# Patient Record
Sex: Male | Born: 1985 | Race: White | Hispanic: No | Marital: Single | Smoking: Current every day smoker
Health system: Southern US, Community
[De-identification: ages and names within clinical notes are randomized; demographics above are authoritative.]

## PROBLEM LIST (undated history)

## (undated) HISTORY — PX: NO PAST SURGERIES: SHX2092

---

## 2005-09-09 ENCOUNTER — Encounter (HOSPITAL_COMMUNITY): Admission: RE | Admit: 2005-09-09 | Discharge: 2005-10-09 | Payer: Self-pay | Admitting: Orthopaedic Surgery

## 2007-06-01 IMAGING — NM NM BONE 3 PHASE
2 series · 12 of 12 positions shown · non-contrast
Comparison: none

HISTORY: Left foot pain

[Series 1: flow and static · 4.66mm/px · 6 of 32 frames shown (1 of 2)]
[frame 3/32]
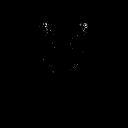
[frame 8/32]
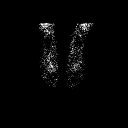
[frame 14/32]
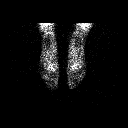
[frame 19/32]
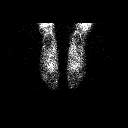
[frame 24/32]
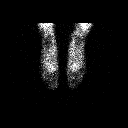
[frame 30/32  full-range]
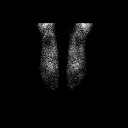

[Series 1: flow and static · 4.66mm/px · 6 of 32 frames shown (2 of 2)]
[frame 3/32]
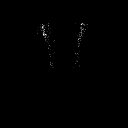
[frame 8/32]
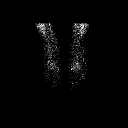
[frame 14/32]
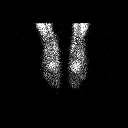
[frame 19/32]
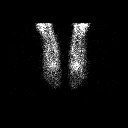
[frame 24/32]
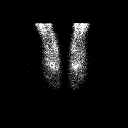
[frame 30/32  full-range]
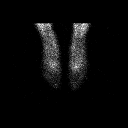

[12 of 12 positions shown; findings below may reference images not displayed]

NUCLEAR MEDICINE THREE-PHASE BONE SCAN LIMITED OF THE FEET:

Three-phase bone scan of the feet performed following 25 mCi GcNNm MDP.
Exam correlated with outside radiographs from [REDACTED]
[HOSPITAL][HOSPITAL] 09/05/2005.

Blood flow images demonstrate symmetric blood flow to both feet.
Blood pool images demonstrate symmetric blood pool in both feet.
Delayed static images show symmetric tracer distribution in both feet.
No focal areas of abnormal increased or decreased tracer localization seen.
IMPRESSION: Normal 3 phase bone scan of the feet.

## 2012-01-14 ENCOUNTER — Emergency Department (HOSPITAL_COMMUNITY)
Admission: EM | Admit: 2012-01-14 | Discharge: 2012-01-14 | Disposition: A | Discharge status: Home / Self Care | Payer: Self-pay | Attending: Emergency Medicine | Admitting: Emergency Medicine

## 2012-01-14 ENCOUNTER — Encounter (HOSPITAL_COMMUNITY): Payer: Self-pay | Admitting: *Deleted

## 2012-01-14 DIAGNOSIS — R112 Nausea with vomiting, unspecified: Secondary | ICD-10-CM | POA: Insufficient documentation

## 2012-01-14 DIAGNOSIS — R42 Dizziness and giddiness: Secondary | ICD-10-CM | POA: Insufficient documentation

## 2012-01-14 MED ORDER — LORAZEPAM 1 MG PO TABS: 1.0000 mg | ORAL_TABLET | Freq: Three times a day (TID) | ORAL | Status: DC | PRN

## 2012-01-14 MED ORDER — LORAZEPAM 1 MG PO TABS
1.0000 mg | ORAL_TABLET | Freq: Once | ORAL | Status: AC
  Administered 2012-01-14: 1 mg via ORAL
  Filled 2012-01-14: qty 1

## 2012-01-14 MED ORDER — MECLIZINE HCL 25 MG PO TABS: 25.0000 mg | ORAL_TABLET | Freq: Four times a day (QID) | ORAL | Status: DC

## 2012-01-14 MED ORDER — MECLIZINE HCL 12.5 MG PO TABS
25.0000 mg | ORAL_TABLET | Freq: Once | ORAL | Status: AC
Start: 2012-01-14 — End: 2012-01-14
  Administered 2012-01-14: 25 mg via ORAL
  Filled 2012-01-14: qty 2

## 2012-01-14 NOTE — ED Provider Notes (Addendum)
History  This chart was scribed for Donnetta Hutching, MD by Manuela Schwartz, ED scribe. This patient was seen in room APA18/APA18 and the patient's care was started at 1147.   CSN: [id number]  Arrival date & time 01/14/12  1147   First MD Initiated Contact with Patient 01/14/12 1159      Chief Complaint  Patient presents with  . Dizziness   HPI Hunter Armstrong is a 26 y.o. male who presents to the Emergency Department complaining of constant dizziness since this AM which began while he was sitting at his office desk this AM while working at his computer. He states he began feeling dizzy which made him feel nauseated/diaphoretic and had 1 emesis episode. He states after walking from his desk he still felt uneasy so he came here to ED. He denies any similar previous episodes. He has no pertinent medical hx and tried taking coricidin without any relief from his symptoms. He denies any ear pain, ear fullness or sore throat. He states he still currently feels dizzy.  No PCP  History reviewed. No pertinent past medical history.  History reviewed. No pertinent past surgical history.  History reviewed. No pertinent family history.  History  Substance Use Topics  . Smoking status: Never Smoker   . Smokeless tobacco: Not on file  . Alcohol Use: Yes     Comment: rarely      Review of Systems  All other systems reviewed and are negative.    Allergies  Review of patient's allergies indicates no known allergies.  Home Medications   Current Outpatient Rx  Name  Route  Sig  Dispense  Refill  . VICKS VAPOR INHALER IN   Inhalation   Inhale 1 puff into the lungs daily as needed. Congestion.         Merrilee Seashore D PO   Oral   Take 1 tablet by mouth daily as needed. Cold.           Triage vitals: BP 122/76  Pulse 78  Temp 97.5 F (36.4 C) (Oral)  Resp 18  Ht 6' (1.829 m)  Wt 170 lb (77.111 kg)  BMI 23.06 kg/m2  SpO2 100%  Physical Exam  Nursing note and vitals  reviewed. Constitutional: He is oriented to person, place, and time. He appears well-developed and well-nourished.  HENT:  Head: Normocephalic and atraumatic.  Eyes: Conjunctivae normal and EOM are normal. Pupils are equal, round, and reactive to light.  Neck: Normal range of motion. Neck supple.  Cardiovascular: Normal rate, regular rhythm and normal heart sounds.   Pulmonary/Chest: Effort normal and breath sounds normal.  Abdominal: Soft. Bowel sounds are normal.  Musculoskeletal: Normal range of motion.  Neurological: He is alert and oriented to person, place, and time.  Skin: Skin is warm and dry.  Psychiatric: He has a normal mood and affect.    ED Course  Procedures (including critical care time) DIAGNOSTIC STUDIES: Oxygen Saturation is 100% on room air, normal by my interpretation.    COORDINATION OF CARE: At 1230 PM Discussed treatment plan with patient which includes EKG, ativan, antivert. Patient agrees.     Date: 01/14/2012  Rate: 62  Rhythm: normal sinus rhythm  QRS Axis: normal  Intervals: normal  ST/T Wave abnormalities: normal  Conduction Disutrbances: none  Narrative Interpretation: unremarkable  c SA  Labs Reviewed - No data to display No results found.   No diagnosis found.    MDM   History and physical congruent with vertigo.  No obvious neuro deficits. Patient extremely low risk for any neurological event. Discharge home on Antivert 25 mg #15 and Ativan 1 mg #15  I personally performed the services described in this documentation, which was scribed in my presence. The recorded information has been reviewed and is accurate.          Donnetta Hutching, MD 01/14/12 1454  Donnetta Hutching, MD 01/31/12 1610  Donnetta Hutching, MD 02/18/12 [phone]

## 2012-01-14 NOTE — ED Notes (Signed)
Pt was sitting at desk, sudden onset dizziness, nausea, vomiting,,  Feels shakey.

## 2012-01-14 NOTE — ED Provider Notes (Deleted)
History     CSN: [id number]  Arrival date & time 01/14/12  1147   First MD Initiated Contact with Patient 01/14/12 1159      Chief Complaint  Patient presents with  . Dizziness    (Consider location/radiation/quality/duration/timing/severity/associated sxs/prior treatment) HPI  History reviewed. No pertinent past medical history.  History reviewed. No pertinent past surgical history.  History reviewed. No pertinent family history.  History  Substance Use Topics  . Smoking status: Never Smoker   . Smokeless tobacco: Not on file  . Alcohol Use: Yes     Comment: rarely      Review of Systems  Allergies  Review of patient's allergies indicates no known allergies.  Home Medications  No current outpatient prescriptions on file.  BP 122/76  Pulse 78  Temp 97.5 F (36.4 C) (Oral)  Resp 18  Ht 6' (1.829 m)  Wt 170 lb (77.111 kg)  BMI 23.06 kg/m2  SpO2 100%  Physical Exam  ED Course  Procedures (including critical care time)    Date: 01/14/2012  Rate: 62  Rhythm: normal sinus rhythm  QRS Axis: normal  Intervals: normal  ST/T Wave abnormalities: normal  Conduction Disutrbances: none  Narrative Interpretation: unremarkable c SA   Labs Reviewed - No data to display No results found.   No diagnosis found.    MDM  See scribed chart from 11/19/ 2013 at 14:54 by Ladona Ridgel Day.   I personally performed the services described in this documentation, which was scribed in my presence. The recorded information has been reviewed and is accurate.         Donnetta Hutching, MD 01/21/12 2134

## 2014-01-31 ENCOUNTER — Encounter: Payer: Self-pay | Admitting: Gastroenterology

## 2014-03-11 ENCOUNTER — Ambulatory Visit: Payer: Self-pay | Admitting: Gastroenterology

## 2014-04-08 ENCOUNTER — Encounter: Payer: Self-pay | Admitting: Gastroenterology

## 2014-04-08 ENCOUNTER — Ambulatory Visit (INDEPENDENT_AMBULATORY_CARE_PROVIDER_SITE_OTHER): Payer: BC Managed Care – PPO | Admitting: Gastroenterology

## 2014-04-08 VITALS — BP 137/78 | HR 66 | Temp 97.5°F | Ht 72.0 in | Wt 168.2 lb

## 2014-04-08 DIAGNOSIS — R1030 Lower abdominal pain, unspecified: Secondary | ICD-10-CM

## 2014-04-08 DIAGNOSIS — K529 Noninfective gastroenteritis and colitis, unspecified: Secondary | ICD-10-CM | POA: Insufficient documentation

## 2014-04-08 DIAGNOSIS — R109 Unspecified abdominal pain: Secondary | ICD-10-CM | POA: Insufficient documentation

## 2014-04-08 MED ORDER — OMEPRAZOLE MAGNESIUM 20 MG PO TBEC: 20.0000 mg | DELAYED_RELEASE_TABLET | Freq: Every day | ORAL | Status: DC

## 2014-04-08 NOTE — Progress Notes (Signed)
Primary Care Physician:  Pershing ProudJACKSON,SAMANTHA, PA-C  Primary Gastroenterologist:  Jonette EvaSandi Fields, MD   Chief Complaint  Patient presents with  . Abdominal Pain    HPI:  Hunter Armstrong is a 29 y.o. male here for further evaluation of abdominal pain associated with loose stool at the request of Terie PurserSamantha Jackson, PA-C [facility].   Patient reports 10 year history of GI issues including postprandial abdominal crampy, severe gas, fecal urgency and terrible diarrhea. Symptoms usually occur within one hour of meal. Symptoms last for minutes to hours. Improved abdominal pain with bowel movement. Denies constipation. No blood in the stool or melena. Trigger foods include raw onions, tomatoes, spicy and greasy foods. Symptoms really bad with steak. For the most part has been able to control his symptoms with diet, but more difficult to manage over the last couple of months. Denies associated nausea or vomiting. Consumes Limited dairy except for occasional cheese. Recently given prescription for dicyclomine, seems to help with fecal urgency but otherwise continues to have abdominal pain and loose bowel. Just able to control little bit better. In the past had taken a couple of omeprazole which seemed to have helped. Denies typical heartburn. Prior to his mid 1420s he had significant indigestion and heartburn but this seemed to resolve with improved dietary intake.  No previous GI evaluation. Mother diagnosed with IBS. Father has GERD. No family history of IBD, celiac disease.    Current Outpatient Prescriptions  Medication Sig Dispense Refill  . dicyclomine (BENTYL) 20 MG tablet Take 20 mg by mouth 2 (two) times daily.   11   No current facility-administered medications for this visit.    Allergies as of 04/08/2014  . (No Known Allergies)    No past medical history on file.  No past surgical history on file.  Family History  Problem Relation Age of Onset  . Irritable bowel syndrome  Mother   . GER disease Father   . Inflammatory bowel disease Neg Hx   . Colon cancer Neg Hx     History   Social History  . Marital Status: Single    Spouse Name: N/A  . Number of Children: 0  . Years of Education: N/A   Occupational History  . English professor at [employer]    Social History Main Topics  . Smoking status: Never Smoker   . Smokeless tobacco: Not on file  . Alcohol Use: Yes     Comment: rarely  . Drug Use: No  . Sexual Activity: Not on file   Other Topics Concern  . Not on file   Social History Narrative      ROS:  General: Negative for anorexia, weight loss, fever, chills, fatigue, weakness. Eyes: Negative for vision changes.  ENT: Negative for hoarseness, difficulty swallowing , nasal congestion. CV: Negative for chest pain, angina, palpitations, dyspnea on exertion, peripheral edema.  Respiratory: Negative for dyspnea at rest, dyspnea on exertion, cough, sputum, wheezing.  GI: See history of present illness. GU:  Negative for dysuria, hematuria, urinary incontinence, urinary frequency, nocturnal urination.  MS: Negative for joint pain, low back pain.  Derm: Negative for rash or itching.  Neuro: Negative for weakness, abnormal sensation, seizure, frequent headaches, memory loss, confusion.  Psych: Negative for anxiety, depression, suicidal ideation, hallucinations.  Endo: Negative for unusual weight change.  Heme: Negative for bruising or bleeding. Allergy: Negative for rash or hives.    Physical Examination:  BP 137/78 mmHg  Pulse 66  Temp(Src) 97.5 F (  36.4 C) (Oral)  Ht 6' (1.829 m)  Wt 168 lb 3.2 oz (76.295 kg)  BMI 22.81 kg/m2   General: Well-nourished, well-developed in no acute distress.  Head: Normocephalic, atraumatic.   Eyes: Conjunctiva pink, no icterus. Mouth: Oropharyngeal mucosa moist and pink , no lesions erythema or exudate. Neck: Supple without thyromegaly, masses, or lymphadenopathy.  Lungs: Clear to auscultation  bilaterally.  Heart: Regular rate and rhythm, no murmurs rubs or gallops.  Abdomen: Bowel sounds are normal, nontender, nondistended, no hepatosplenomegaly or masses, no abdominal bruits or    hernia , no rebound or guarding.   Rectal: not performed Extremities: No lower extremity edema. No clubbing or deformities.  Neuro: Alert and oriented x 4 , grossly normal neurologically.  Skin: Warm and dry, no rash or jaundice.   Psych: Alert and cooperative, normal mood and affect.  Labs: Labs have been requested.  Imaging Studies: No results found.

## 2014-04-08 NOTE — Assessment & Plan Note (Signed)
29 year old gentleman with 10 year history of postprandial abdominal pain associated with frequent loose stool. Occasional nocturnal symptoms. Minimal improvement with dicyclomine. No family history of IBD, celiac disease. Mother has history of IBS. Symptoms sound pretty typical for IBS but offered workup to exclude other etiologies per patient's request.  We have requested recent labs. We will screen for celiac disease. I FOBT. Based on findings may offer ileocolonoscopy to rule out IBD. Further recommendations to follow.  IBS literature provided today.

## 2014-04-08 NOTE — Patient Instructions (Signed)
Please have your labs and stool test done.  Start Prilosec OTC once daily before breakfast for next 2-3 weeks.   We will call with results and decide about colonoscopy at that time based on findings.   I suspect symptoms are related to IBS but we will rule out other more serious diagnoses.   Irritable Bowel Syndrome Irritable bowel syndrome (IBS) is caused by a disturbance of normal bowel function and is a common digestive disorder. You may also hear this condition called spastic colon, mucous colitis, and irritable colon. There is no cure for IBS. However, symptoms often gradually improve or disappear with a good diet, stress management, and medicine. This condition usually appears in late adolescence or early adulthood. Women develop it twice as often as men. CAUSES  After food has been digested and absorbed in the small intestine, waste material is moved into the large intestine, or colon. In the colon, water and salts are absorbed from the undigested products coming from the small intestine. The remaining residue, or fecal material, is held for elimination. Under normal circumstances, gentle, rhythmic contractions of the bowel walls push the fecal material along the colon toward the rectum. In IBS, however, these contractions are irregular and poorly coordinated. The fecal material is either retained too long, resulting in constipation, or expelled too soon, producing diarrhea. SIGNS AND SYMPTOMS  The most common symptom of IBS is abdominal pain. It is often in the lower left side of the abdomen, but it may occur anywhere in the abdomen. The pain comes from spasms of the bowel muscles happening too much and from the buildup of gas and fecal material in the colon. This pain:  Can range from sharp abdominal cramps to a dull, continuous ache.  Often worsens soon after eating.  Is often relieved by having a bowel movement or passing gas. Abdominal pain is usually accompanied by constipation,  but it may also produce diarrhea. The diarrhea often occurs right after a meal or upon waking up in the morning. The stools are often soft, watery, and flecked with mucus. Other symptoms of IBS include:  Bloating.  Loss of appetite.  Heartburn.  Backache.  Dull pain in the arms or shoulders.  Nausea.  Burping.  Vomiting.  Gas. IBS may also cause symptoms that are unrelated to the digestive system, such as:  Fatigue.  Headaches.  Anxiety.  Shortness of breath.  Trouble concentrating.  Dizziness. These symptoms tend to come and go. DIAGNOSIS  The symptoms of IBS may seem like symptoms of other, more serious digestive disorders. Your health care provider may want to perform tests to exclude these disorders.  TREATMENT Many medicines are available to help correct bowel function or relieve bowel spasms and abdominal pain. Among the medicines available are:  Laxatives for severe constipation and to help restore normal bowel habits.  Specific antidiarrheal medicines to treat severe or lasting diarrhea.  Antispasmodic agents to relieve intestinal cramps. Your health care provider may also decide to treat you with a mild tranquilizer or sedative during unusually stressful periods in your life. Your health care provider may also prescribe antidepressant medicine. The use of this medicine has been shown to reduce pain and other symptoms of IBS. Remember that if any medicine is prescribed for you, you should take it exactly as directed. Make sure your health care provider knows how well it worked for you. HOME CARE INSTRUCTIONS   Take all medicines as directed by your health care provider.  Avoid foods  that are high in fat or oils, such as heavy cream, butter, frankfurters, sausage, and other fatty meats.  Avoid foods that make you go to the bathroom, such as fruit, fruit juice, and dairy products.  Cut out carbonated drinks, chewing gum, and "gassy" foods such as beans and  cabbage. This may help relieve bloating and burping.  Eat foods with bran, and drink plenty of liquids with the bran foods. This helps relieve constipation.  Keep track of what foods seem to bring on your symptoms.  Avoid emotionally charged situations or circumstances that produce anxiety.  Start or continue exercising.  Get plenty of rest and sleep. Document Released: 02/11/2005 Document Revised: 02/16/2013 Document Reviewed: 10/02/2007 [facility] Patient Information 2015 [employer] [place]. This information is not intended to replace advice given to you by your health care provider. Make sure you discuss any questions you have with your health care provider.

## 2014-04-18 NOTE — Progress Notes (Signed)
cc'ed to pcp °

## 2014-06-07 NOTE — Progress Notes (Signed)
Labs reviewed from December 2015 performed by PCP. BUN 14, creatinine 1.12, total bilirubin 0.3, alkaline phosphatase 45, AST 12, ALT 8, albumin 4.6, white blood cell count 6400, hemoglobin 14.4, platelets 167,000   Please asked patient if he plans to complete labs and stool studies so that we can determine course of action/next step in management.

## 2014-06-08 NOTE — Progress Notes (Signed)
Letter mailed to pt also to call and let us know.

## 2014-06-08 NOTE — Progress Notes (Signed)
LMOM to call and let us know if he will do the stool studies and labs.

## 2017-12-18 NOTE — Progress Notes (Signed)
REVIEWED-NO ADDITIONAL RECOMMENDATIONS. 

## 2022-09-07 ENCOUNTER — Other Ambulatory Visit: Payer: Self-pay

## 2022-09-07 ENCOUNTER — Emergency Department (HOSPITAL_COMMUNITY)
Admission: EM | Admit: 2022-09-07 | Discharge: 2022-09-07 | Disposition: A | Discharge status: Home / Self Care | Payer: BC Managed Care – PPO | Attending: Emergency Medicine | Admitting: Emergency Medicine

## 2022-09-07 ENCOUNTER — Encounter (HOSPITAL_COMMUNITY): Payer: Self-pay | Admitting: *Deleted

## 2022-09-07 ENCOUNTER — Emergency Department (HOSPITAL_COMMUNITY): Payer: BC Managed Care – PPO

## 2022-09-07 DIAGNOSIS — K625 Hemorrhage of anus and rectum: Secondary | ICD-10-CM | POA: Diagnosis present

## 2022-09-07 DIAGNOSIS — K529 Noninfective gastroenteritis and colitis, unspecified: Secondary | ICD-10-CM | POA: Insufficient documentation

## 2022-09-07 LAB — COMPREHENSIVE METABOLIC PANEL
ALT: 20 U/L (ref 0–44)
AST: 22 U/L (ref 15–41)
Albumin: 4.3 g/dL (ref 3.5–5.0)
Alkaline Phosphatase: 53 U/L (ref 38–126)
Anion gap: 16 — ABNORMAL HIGH (ref 5–15)
BUN: 17 mg/dL (ref 6–20)
CO2: 21 mmol/L — ABNORMAL LOW (ref 22–32)
Calcium: 9.5 mg/dL (ref 8.9–10.3)
Chloride: 103 mmol/L (ref 98–111)
Creatinine, Ser: 1.2 mg/dL (ref 0.61–1.24)
GFR, Estimated: >60 mL/min (ref >60)
Glucose, Bld: 129 mg/dL — ABNORMAL HIGH (ref 70–99)
Potassium: 3.5 mmol/L (ref 3.5–5.1)
Sodium: 140 mmol/L (ref 135–145)
Total Bilirubin: 1 mg/dL (ref 0.3–1.2)
Total Protein: 6.6 g/dL (ref 6.5–8.1)

## 2022-09-07 LAB — TYPE AND SCREEN
ABO/RH(D): A POS
Antibody Screen: NEGATIVE

## 2022-09-07 LAB — CBC
HCT: 43.6 % (ref 39.0–52.0)
Hemoglobin: 14.8 g/dL (ref 13.0–17.0)
MCH: 29.5 pg (ref 26.0–34.0)
MCHC: 33.9 g/dL (ref 30.0–36.0)
MCV: 86.9 fL (ref 80.0–100.0)
Platelets: 176 10*3/uL (ref 150–400)
RBC: 5.02 MIL/uL (ref 4.22–5.81)
RDW: 13.1 % (ref 11.5–15.5)
WBC: 15 10*3/uL — ABNORMAL HIGH (ref 4.0–10.5)
nRBC: 0 % (ref 0.0–0.2)

## 2022-09-07 MED ORDER — AMOXICILLIN-POT CLAVULANATE 875-125 MG PO TABS: 1.0000 | ORAL_TABLET | Freq: Two times a day (BID) | ORAL | 0 refills | Status: DC

## 2022-09-07 MED ORDER — METRONIDAZOLE 500 MG/100ML IV SOLN: 500.0000 mg | Freq: Once | INTRAVENOUS | Status: DC

## 2022-09-07 MED ORDER — CIPROFLOXACIN IN D5W 400 MG/200ML IV SOLN: 400.0000 mg | Freq: Once | INTRAVENOUS | Status: DC

## 2022-09-07 MED ORDER — SODIUM CHLORIDE 0.9 % IV BOLUS
1000.0000 mL | Freq: Once | INTRAVENOUS | Status: AC
  Administered 2022-09-07: 1000 mL via INTRAVENOUS

## 2022-09-07 MED ORDER — PREDNISONE 20 MG PO TABS
60.0000 mg | ORAL_TABLET | ORAL | Status: AC
  Administered 2022-09-07: 60 mg via ORAL
  Filled 2022-09-07: qty 3

## 2022-09-07 MED ORDER — METHYLPREDNISOLONE SODIUM SUCC 125 MG IJ SOLR: 125.0000 mg | Freq: Once | INTRAMUSCULAR | Status: DC

## 2022-09-07 MED ORDER — AMOXICILLIN-POT CLAVULANATE 875-125 MG PO TABS
1.0000 | ORAL_TABLET | Freq: Once | ORAL | Status: AC
  Administered 2022-09-07: 1 via ORAL
  Filled 2022-09-07: qty 1

## 2022-09-07 MED ORDER — PREDNISONE 20 MG PO TABS: 40.0000 mg | ORAL_TABLET | Freq: Every day | ORAL | 0 refills | Status: DC

## 2022-09-07 MED ORDER — IOHEXOL 350 MG/ML SOLN
75.0000 mL | Freq: Once | INTRAVENOUS | Status: AC | PRN
  Administered 2022-09-07: 75 mL via INTRAVENOUS

## 2022-09-07 NOTE — ED Provider Notes (Signed)
[place] EMERGENCY DEPARTMENT AT [facility] Provider Note   CSN: [id number] Arrival date & time: 09/07/22  1005     History  Chief Complaint  Patient presents with   Rectal Bleeding    Hunter Armstrong is a 37 y.o. male.  HPI Patient presents with concern of rectal bleeding.  He notes that today, with a bowel movement he had a small amount of bright red blood.  He developed abdominal pain, and after another bowel movement with more red blood he presents for evaluation.  Notes a history of IBS diagnosed in the distant past, but states that he had been doing generally well.  He has been using dietary changes as therapy for his IBS. He states that he is otherwise generally well.  Today, no other complaints including chest pain, syncope, near syncope.    Home Medications Prior to Admission medications   Medication Sig Start Date End Date Taking? Authorizing Provider  amoxicillin-clavulanate (AUGMENTIN) 875-125 MG tablet Take 1 tablet by mouth every 12 (twelve) hours. 09/07/22  Yes Gerhard Munch, MD  predniSONE (DELTASONE) 20 MG tablet Take 2 tablets (40 mg total) by mouth daily with breakfast. For the next four days 09/07/22  Yes Gerhard Munch, MD  dicyclomine (BENTYL) 20 MG tablet Take 20 mg by mouth 2 (two) times daily.  01/31/14   [provider]  omeprazole (PRILOSEC OTC) 20 MG tablet Take 1 tablet (20 mg total) by mouth daily. 04/08/14   Tiffany Kocher, PA-C      Allergies    Patient has no known allergies.    Review of Systems   Review of Systems  All other systems reviewed and are negative.   Physical Exam Updated Vital Signs BP 115/82 (BP Location: Right Arm)   Pulse (!) 58   Temp 97.8 F (36.6 C) (Oral)   Resp 14   Ht 6' (1.829 m)   Wt 83.9 kg   SpO2 100%   BMI 25.09 kg/m  Physical Exam Vitals and nursing note reviewed.  Constitutional:      General: He is not in acute distress.    Appearance: He is well-developed.  HENT:     Head:  Normocephalic and atraumatic.  Eyes:     Conjunctiva/sclera: Conjunctivae normal.  Pulmonary:     Effort: Pulmonary effort is normal. No respiratory distress.     Breath sounds: No stridor.  Abdominal:     General: There is no distension.     Tenderness: There is abdominal tenderness.  Skin:    General: Skin is warm and dry.  Neurological:     Mental Status: He is alert and oriented to person, place, and time.     ED Results / Procedures / Treatments   Labs (all labs ordered are listed, but only abnormal results are displayed) Labs Reviewed  COMPREHENSIVE METABOLIC PANEL - Abnormal; Notable for the following components:      Result Value   CO2 21 (*)    Glucose, Bld 129 (*)    Anion gap 16 (*)    All other components within normal limits  CBC - Abnormal; Notable for the following components:   WBC 15.0 (*)    All other components within normal limits  TYPE AND SCREEN    EKG None  Radiology CT ABDOMEN PELVIS W CONTRAST  Result Date: 09/07/2022 CLINICAL DATA:  Crohn disease exacerbation. EXAM: CT ABDOMEN AND PELVIS WITH CONTRAST TECHNIQUE: Multidetector CT imaging of the abdomen and pelvis was performed  using the standard protocol following bolus administration of intravenous contrast. RADIATION DOSE REDUCTION: This exam was performed according to the departmental dose-optimization program which includes automated exposure control, adjustment of the mA and/or kV according to patient size and/or use of iterative reconstruction technique. CONTRAST:  75mL OMNIPAQUE IOHEXOL 350 MG/ML SOLN COMPARISON:  None Available. FINDINGS: Lower Chest: No acute findings. Hepatobiliary: No suspicious hepatic masses identified. Gallbladder is unremarkable. No evidence of biliary ductal dilatation. Pancreas:  No mass or inflammatory changes. Spleen: Within normal limits in size and appearance. Adrenals/Urinary Tract: No suspicious masses identified. No evidence of ureteral calculi or hydronephrosis.  Stomach/Bowel: No evidence of obstruction. Normal appendix visualized. Mild-to-moderate wall thickening is seen involving the distal transverse and descending colon, consistent with colitis. No No evidence of abnormal wall thickening involving the small bowel are terminal ileum. No evidence of abscess. Vascular/Lymphatic: No pathologically enlarged lymph nodes. No acute vascular findings. Reproductive:  No mass or other significant abnormality. Other:  None. Musculoskeletal:  No suspicious bone lesions identified. IMPRESSION: Mild-to-moderate colitis involving the distal transverse and descending colon. No evidence of small bowel involvement, obstruction, or abscess. Electronically Signed   By: Danae Orleans M.D.   On: 09/07/2022 12:29    Procedures Procedures    Medications Ordered in ED Medications  predniSONE (DELTASONE) tablet 60 mg (has no administration in time range)  amoxicillin-clavulanate (AUGMENTIN) 875-125 MG per tablet 1 tablet (has no administration in time range)  sodium chloride 0.9 % bolus 1,000 mL (0 mLs Intravenous Stopped 09/07/22 1428)  iohexol (OMNIPAQUE) 350 MG/ML injection 75 mL (75 mLs Intravenous Contrast Given 09/07/22 1207)    ED Course/ Medical Decision Making/ A&P                             Medical Decision Making Patient with IBS presents with rectal bleeding abdominal pain.  Broad differential including exsanguination, abscess, IBS flare, colitis considered. Cardiac 60 sinus normal Pulse ox 100% room air normal   Amount and/or Complexity of Data Reviewed External Data Reviewed: notes. Labs: ordered. Decision-making details documented in ED Course.    Details: Hemoglobin unremarkable Radiology: ordered and independent interpretation performed. Decision-making details documented in ED Course.    Details: CT reviewed, discussed with GI, evidence for colitis  Risk Prescription drug management. Decision regarding hospitalization.   2:34 PM Patient in no  distress, awake, alert, ambulatory.  I discussed this case with her GI colleagues.  No evidence for distress, bacteremia, sepsis, hemodynamic instability.  However, with ongoing GI bleed, evidence for colitis in the context of known IBS we arranged for close outpatient follow-up, initiation of appropriate meds.        Final Clinical Impression(s) / ED Diagnoses Final diagnoses:  Colitis    Rx / DC Orders ED Discharge Orders          Ordered    amoxicillin-clavulanate (AUGMENTIN) 875-125 MG tablet  Every 12 hours        09/07/22 1432    predniSONE (DELTASONE) 20 MG tablet  Daily with breakfast        09/07/22 1432              Gerhard Munch, MD 09/07/22 1434

## 2022-09-07 NOTE — Discharge Instructions (Signed)
As discussed, with your colitis, identified on CT scan today it is importantly follow-up with our gastroenterology colleagues.  Please obtain and take your medication until you have seen those individuals.  Return here for concerning changes in your condition.

## 2022-09-07 NOTE — ED Triage Notes (Signed)
C/o abd pain and bright red rectal bleeding onset this am 4am

## 2022-09-10 ENCOUNTER — Telehealth: Payer: Self-pay

## 2022-09-10 NOTE — Telephone Encounter (Signed)
Spoke with the patient. Agrees to an appointment 09/17/22 at 11:00 am. Address given to the patient. Agrees to arrive 10 to 15 minutes before the appointment. No further questions.

## 2022-09-10 NOTE — Telephone Encounter (Signed)
-----   Message from Mike Gip sent at 09/07/2022  1:55 PM EDT ----- Regarding: office appt -urgent Beth, please call this patient on Monday. We were called by the ER today, he presented with what sounds like an acute colitis, labs were stable has prior history of IBS. He will be a new patient for [place]  Ideally he needs an office appointment within 7 to 10 days, anybody is fine thank you

## 2022-09-17 ENCOUNTER — Encounter: Payer: Self-pay | Admitting: Physician Assistant

## 2022-09-17 ENCOUNTER — Ambulatory Visit: Payer: BC Managed Care – PPO | Admitting: Physician Assistant

## 2022-09-17 VITALS — BP 124/80 | HR 78 | Ht 72.0 in | Wt 188.0 lb

## 2022-09-17 DIAGNOSIS — R197 Diarrhea, unspecified: Secondary | ICD-10-CM | POA: Diagnosis not present

## 2022-09-17 DIAGNOSIS — R935 Abnormal findings on diagnostic imaging of other abdominal regions, including retroperitoneum: Secondary | ICD-10-CM | POA: Diagnosis not present

## 2022-09-17 DIAGNOSIS — R1084 Generalized abdominal pain: Secondary | ICD-10-CM

## 2022-09-17 DIAGNOSIS — K921 Melena: Secondary | ICD-10-CM | POA: Diagnosis not present

## 2022-09-17 MED ORDER — NA SULFATE-K SULFATE-MG SULF 17.5-3.13-1.6 GM/177ML PO SOLN: 1.0000 | Freq: Once | ORAL | 0 refills | Status: AC

## 2022-09-17 NOTE — Progress Notes (Signed)
Agree with assessment and plan as outlined.  Colonoscopy recommended to ensure no evidence of IBD.  Thanks

## 2022-09-17 NOTE — Patient Instructions (Signed)
You have been scheduled for a colonoscopy. Please follow written instructions given to you at your visit today.   Please pick up your prep supplies at the pharmacy within the next 1-3 days.  If you use inhalers (even only as needed), please bring them with you on the day of your procedure.  DO NOT TAKE 7 DAYS PRIOR TO TEST- Trulicity (dulaglutide) Ozempic, Wegovy (semaglutide) Mounjaro (tirzepatide) Bydureon Bcise (exanatide extended release)  DO NOT TAKE 1 DAY PRIOR TO YOUR TEST Rybelsus (semaglutide) Adlyxin (lixisenatide) Victoza (liraglutide) Byetta (exanatide) _______________________________________________________________________  _______________________________________________________  If your blood pressure at your visit was 140/90 or greater, please contact your primary care physician to follow up on this.  _______________________________________________________  If you are age 11 or older, your body mass index should be between 23-30. Your Body mass index is 25.5 kg/m. If this is out of the aforementioned range listed, please consider follow up with your Primary Care Provider.  If you are age 29 or younger, your body mass index should be between 19-25. Your Body mass index is 25.5 kg/m. If this is out of the aformentioned range listed, please consider follow up with your Primary Care Provider.   ________________________________________________________  The [facility] providers would like to encourage you to use [facility] to communicate with providers for non-urgent requests or questions.  Due to long hold times on the telephone, sending your provider a message by [facility] may be a faster and more efficient way to get a response.  Please allow 48 business hours for a response.  Please remember that this is for non-urgent requests.  _______________________________________________________

## 2022-09-17 NOTE — Progress Notes (Signed)
Chief Complaint: Follow-up after ER visit for colitis  HPI:    [name] is a 37 year old male with no pertinent past medical history, who presents to clinic today after being seen in the ER by Dr. Jeraldine Loots for colitis.    09/07/2022 ER visit with concern for rectal bleeding and diarrhea with abdominal pain.  Labs with a glucose elevated at 129 and a WBC of 15 otherwise normal.  CT with contrast showed mild to moderate colitis involving the distal transverse and descending colon.  Patient given Augmentin 875-125 twice daily x 7 days and Prednisone 20 mg for a week.    Today, the patient tells me that all of his symptoms have gone away but he has extremely limited his diet with no sugar or greasy/fatty foods.  Tells me that his stools are more formed and he is not having any further discomfort or bleeding, now finished with antibiotics.  Does tell me though he has a history of similar symptoms that have been a quarter to half of the month over the past 10+ years with generalized abdominal pain oftentimes diarrhea, sometimes just 30 minutes after eating and occasionally bleeding.    Denies fever, chills, weight loss, nausea, vomiting, heartburn, reflux or family history of IBD or colon cancer.  History reviewed. No pertinent past medical history.  Past Surgical History:  Procedure Laterality Date   NO PAST SURGERIES      No current outpatient medications on file.   No current facility-administered medications for this visit.    Allergies as of 09/17/2022   (No Known Allergies)    Family History  Problem Relation Age of Onset   Irritable bowel syndrome Mother    GER disease Father    Inflammatory bowel disease Neg Hx    Colon cancer Neg Hx    Stomach cancer Neg Hx    Esophageal cancer Neg Hx    Pancreatic cancer Neg Hx     Social History   Socioeconomic History   Marital status: Single    Spouse name: Not on file   Number of children: 0   Years of education: Not on file    Highest education level: Not on file  Occupational History   Occupation: English professor at [employer]  Tobacco Use   Smoking status: Every Day    Current packs/day: 0.50    Types: Cigarettes   Smokeless tobacco: Never  Vaping Use   Vaping status: Never Used  Substance and Sexual Activity   Alcohol use: Yes    Comment: rarely   Drug use: No   Sexual activity: Not on file  Other Topics Concern   Not on file  Social History Narrative   Not on file   Social Determinants of Health   Financial Resource Strain: Not on file  Food Insecurity: Not on file  Transportation Needs: Not on file  Physical Activity: Not on file  Stress: Not on file  Social Connections: Not on file  Intimate Partner Violence: Not on file    Review of Systems:    Constitutional: No weight loss, fever or chills Skin: No rash  Cardiovascular: No chest pain   Respiratory: No SOB  Gastrointestinal: See HPI and otherwise negative Genitourinary: No dysuria  Neurological: No headache, dizziness or syncope Musculoskeletal: No new muscle or joint pain Hematologic: No bruising Psychiatric: No history of depression or anxiety   Physical Exam:  Vital signs: BP 124/80   Pulse 78   Ht 6' (1.829 m)  Wt 188 lb (85.3 kg)   SpO2 98%   BMI 25.50 kg/m    Constitutional:   Pleasant Caucasian male appears to be in NAD, Well developed, Well nourished, alert and cooperative Head:  Normocephalic and atraumatic. Eyes:   PEERL, EOMI. No icterus. Conjunctiva pink. Ears:  Normal auditory acuity. Neck:  Supple Throat: Oral cavity and pharynx without inflammation, swelling or lesion.  Respiratory: Respirations even and unlabored. Lungs clear to auscultation bilaterally.   No wheezes, crackles, or rhonchi.  Cardiovascular: Normal S1, S2. No MRG. Regular rate and rhythm. No peripheral edema, cyanosis or pallor.  Gastrointestinal:  Soft, nondistended, nontender. No rebound or guarding. Normal bowel sounds. No appreciable masses  or hepatomegaly. Rectal:  Not performed.  Msk:  Symmetrical without gross deformities. Without edema, no deformity or joint abnormality.  Neurologic:  Alert and  oriented x4;  grossly normal neurologically.  Skin:   Dry and intact without significant lesions or rashes. Psychiatric: Demonstrates good judgement and reason without abnormal affect or behaviors.  RELEVANT LABS AND IMAGING: CBC    Component Value Date/Time   WBC 15.0 (H) 09/07/2022 1030   RBC 5.02 09/07/2022 1030   HGB 14.8 09/07/2022 1030   HCT 43.6 09/07/2022 1030   PLT 176 09/07/2022 1030   MCV 86.9 09/07/2022 1030   MCH 29.5 09/07/2022 1030   MCHC 33.9 09/07/2022 1030   RDW 13.1 09/07/2022 1030    CMP     Component Value Date/Time   NA 140 09/07/2022 1030   K 3.5 09/07/2022 1030   CL 103 09/07/2022 1030   CO2 21 (L) 09/07/2022 1030   GLUCOSE 129 (H) 09/07/2022 1030   BUN 17 09/07/2022 1030   CREATININE 1.20 09/07/2022 1030   CALCIUM 9.5 09/07/2022 1030   PROT 6.6 09/07/2022 1030   ALBUMIN 4.3 09/07/2022 1030   AST 22 09/07/2022 1030   ALT 20 09/07/2022 1030   ALKPHOS 53 09/07/2022 1030   BILITOT 1.0 09/07/2022 1030   GFRNONAA >60 09/07/2022 1030    Assessment: 1.  Abnormal CT of the abdomen: Recently with colitis treated with Augmentin and Prednisone and symptoms are gone, but does have history of similar symptoms including generalized abdominal pain, diarrhea and hematochezia at least 25% of the month over the past 10 years or so, no family history of IBD; concern for IBD 2.  Generalized abdominal pain 3.  Diarrhea 4.  Hematochezia  Plan: 1.  Scheduled patient for diagnostic colonoscopy in the [facility] with Dr. Adela Lank in 6 weeks.  Did provide the patient a detailed list risks for the procedure and he agrees to proceed. Patient is appropriate for endoscopic procedure(s) in the ambulatory ([facility]) setting.  2.  Patient had questions about what to eat.  Discussed a healthy diet in general, but until after  colonoscopy will not be able to give him specific guidance.  If he does have IBD we can discuss this further or if he has IBS we can discuss low FODMAP. 3.  Patient to follow in clinic per recommendations after time of procedure.  Hyacinth Meeker, PA-C [facility] 09/17/2022, 11:03 AM  Cc: Lewis Moccasin, MD

## 2022-10-29 ENCOUNTER — Encounter: Payer: Self-pay | Admitting: Gastroenterology

## 2022-11-08 ENCOUNTER — Ambulatory Visit (AMBULATORY_SURGERY_CENTER): Payer: BC Managed Care – PPO | Admitting: Gastroenterology

## 2022-11-08 ENCOUNTER — Encounter: Payer: Self-pay | Admitting: Gastroenterology

## 2022-11-08 VITALS — BP 95/62 | HR 60 | Temp 98.4°F | Resp 17 | Ht 73.0 in | Wt 188.0 lb

## 2022-11-08 DIAGNOSIS — R935 Abnormal findings on diagnostic imaging of other abdominal regions, including retroperitoneum: Secondary | ICD-10-CM | POA: Diagnosis not present

## 2022-11-08 DIAGNOSIS — D122 Benign neoplasm of ascending colon: Secondary | ICD-10-CM

## 2022-11-08 DIAGNOSIS — D123 Benign neoplasm of transverse colon: Secondary | ICD-10-CM

## 2022-11-08 HISTORY — PX: COLONOSCOPY: SHX174

## 2022-11-08 MED ORDER — SODIUM CHLORIDE 0.9 % IV SOLN: 500.0000 mL | Freq: Once | INTRAVENOUS | Status: DC

## 2022-11-08 NOTE — Progress Notes (Signed)
Called to room to assist during endoscopic procedure.  Patient ID and intended procedure confirmed with present staff. Received instructions for my participation in the procedure from the performing physician.  

## 2022-11-08 NOTE — Progress Notes (Signed)
[facility] History and Physical   Primary Care Physician:  Lewis Moccasin, MD   Reason for Procedure:   Abnormal CT abdomen - colitis  Plan:    colonoscopy     HPI: Hunter Armstrong is a 37 y.o. male  here for colonoscopy to evaluate CT scan abnormality from 7/13 - mild to moderate colitis of left and transverse colon at the time. Had bleeding / pain / diarrhea at the time which has since resolved. Hopefully infectious etiology but colonoscopy done to exclude IBD. He is feeling better but does have occasional abdominal pains. No further bleeding. No family history of colon cancer known. Otherwise feels well without any cardiopulmonary symptoms.   I have discussed risks / benefits of anesthesia and endoscopic procedure with Lacretia Nicks and they wish to proceed with the exams as outlined today.    History reviewed. No pertinent past medical history.  Past Surgical History:  Procedure Laterality Date   NO PAST SURGERIES      Prior to Admission medications   Not on File    No current outpatient medications on file.   Current Facility-Administered Medications  Medication Dose Route Frequency Provider Last Rate Last Admin   0.9 %  sodium chloride infusion  500 mL Intravenous Once Jillyan Plitt, Willaim Rayas, MD        Allergies as of 11/08/2022   (No Known Allergies)    Family History  Problem Relation Age of Onset   Irritable bowel syndrome Mother    GER disease Father    Inflammatory bowel disease Neg Hx    Colon cancer Neg Hx    Stomach cancer Neg Hx    Esophageal cancer Neg Hx    Pancreatic cancer Neg Hx     Social History   Socioeconomic History   Marital status: Single    Spouse name: Not on file   Number of children: 0   Years of education: Not on file   Highest education level: Not on file  Occupational History   Occupation: English professor at [employer]  Tobacco Use   Smoking status: Every Day    Current packs/day: 0.50    Types: Cigarettes    Smokeless tobacco: Never  Vaping Use   Vaping status: Never Used  Substance and Sexual Activity   Alcohol use: Yes    Comment: rarely   Drug use: No   Sexual activity: Not on file  Other Topics Concern   Not on file  Social History Narrative   Not on file   Social Determinants of Health   Financial Resource Strain: Not on file  Food Insecurity: Not on file  Transportation Needs: Not on file  Physical Activity: Not on file  Stress: Not on file  Social Connections: Not on file  Intimate Partner Violence: Not on file    Review of Systems: All other review of systems negative except as mentioned in the HPI.  Physical Exam: Vital signs BP (!) 99/58   Pulse 65   Temp 98.4 F (36.9 C) (Skin)   Ht 6\' 1"  (1.854 m)   Wt 188 lb (85.3 kg)   SpO2 97%   BMI 24.80 kg/m   General:   Alert,  Well-developed, pleasant and cooperative in NAD Lungs:  Clear throughout to auscultation.   Heart:  Regular rate and rhythm Abdomen:  Soft, nontender and nondistended.   Neuro/Psych:  Alert and cooperative. Normal mood and affect. A and O x 3  Harlin Rain, MD [facility]

## 2022-11-08 NOTE — Op Note (Signed)
[facility] Patient Name: Hunter Armstrong Procedure Date: 11/08/2022 1:16 PM MRN: [id number] Endoscopist: Viviann Spare P. Adela Lank , MD, [id number] Age: 37 Referring MD:  Date of Birth: December 12, 1985 Gender: Male Account #: [id number] Procedure:                Colonoscopy Indications:              Abnormal CT of the GI tract - inflammation of left                            / transverse colon on CT scan July - symptoms of                            pain, rectal bleeding. Has since improved.                            Colonoscopy to rule out IBD. Medicines:                Monitored Anesthesia Care Procedure:                Pre-Anesthesia Assessment:                           - Prior to the procedure, a History and Physical                            was performed, and patient medications and                            allergies were reviewed. The patient's tolerance of                            previous anesthesia was also reviewed. The risks                            and benefits of the procedure and the sedation                            options and risks were discussed with the patient.                            All questions were answered, and informed consent                            was obtained. Prior Anticoagulants: The patient has                            taken no anticoagulant or antiplatelet agents. ASA                            Grade Assessment: I - A normal, healthy patient.                            After reviewing the risks and benefits, the patient  was deemed in satisfactory condition to undergo the                            procedure.                           After obtaining informed consent, the colonoscope                            was passed under direct vision. Throughout the                            procedure, the patient's blood pressure, pulse, and                            oxygen saturations were monitored  continuously. The                            Olympus Scope SN: [id number] was introduced through                            the anus and advanced to the the terminal ileum,                            with identification of the appendiceal orifice and                            IC valve. The colonoscopy was performed without                            difficulty. The patient tolerated the procedure                            well. The quality of the bowel preparation was                            good. The terminal ileum, ileocecal valve,                            appendiceal orifice, and rectum were photographed. Scope In: 1:21:42 PM Scope Out: 1:40:03 PM Scope Withdrawal Time: 0 hours 14 minutes 20 seconds  Total Procedure Duration: 0 hours 18 minutes 21 seconds  Findings:                 The perianal and digital rectal examinations were                            normal.                           The terminal ileum appeared normal.                           A 3 mm polyp was found in the ascending colon. The  polyp was sessile. The polyp was removed with a                            cold snare. Resection and retrieval were complete.                           A 4 mm polyp was found in the transverse colon. The                            polyp was sessile. The polyp was removed with a                            cold snare. Resection and retrieval were complete.                           Multiple small-mouthed diverticula were found in                            the sigmoid colon.                           Internal hemorrhoids were found during                            retroflexion. The hemorrhoids were small.                           The exam was otherwise without abnormality. No                            inflammation noted. Complications:            No immediate complications. Estimated blood loss:                            Minimal. Estimated Blood Loss:      Estimated blood loss was minimal. Impression:               - The examined portion of the ileum was normal.                           - One 3 mm polyp in the ascending colon, removed                            with a cold snare. Resected and retrieved.                           - One 4 mm polyp in the transverse colon, removed                            with a cold snare. Resected and retrieved.                           - Diverticulosis in the sigmoid colon.                           -  Internal hemorrhoids.                           - The examination was otherwise normal.                           No evidence of IBD. Prior changes on CT were likely                            infectious in etiology and have since resolved.                            Incidentally noted to have 2 polyps which were                            removed. Recommendation:           - Patient has a contact number available for                            emergencies. The signs and symptoms of potential                            delayed complications were discussed with the                            patient. Return to normal activities tomorrow.                            Written discharge instructions were provided to the                            patient.                           - Resume previous diet.                           - Continue present medications.                           - Await pathology results. Viviann Spare P. Everlena Mackley, MD 11/08/2022 1:44:39 PM This report has been signed electronically.

## 2022-11-08 NOTE — Progress Notes (Signed)
Sedate, gd SR, tolerated procedure well, VSS, report to RN 

## 2022-11-08 NOTE — Patient Instructions (Signed)
Resume all of your previous medications today as ordered.  Read all of the handouts given to you by your recover room nurse.  YOU HAD AN ENDOSCOPIC PROCEDURE TODAY AT THE [facility]:   Refer to the procedure report that was given to you for any specific questions about what was found during the examination.  If the procedure report does not answer your questions, please call your gastroenterologist to clarify.  If you requested that your care partner not be given the details of your procedure findings, then the procedure report has been included in a sealed envelope for you to review at your convenience later.  YOU SHOULD EXPECT: Some feelings of bloating in the abdomen. Passage of more gas than usual.  Walking can help get rid of the air that was put into your GI tract during the procedure and reduce the bloating. If you had a lower endoscopy (such as a colonoscopy or flexible sigmoidoscopy) you may notice spotting of blood in your stool or on the toilet paper. If you underwent a bowel prep for your procedure, you may not have a normal bowel movement for a few days.  Please Note:  You might notice some irritation and congestion in your nose or some drainage.  This is from the oxygen used during your procedure.  There is no need for concern and it should clear up in a day or so.  SYMPTOMS TO REPORT IMMEDIATELY:  Following lower endoscopy (colonoscopy or flexible sigmoidoscopy):  Excessive amounts of blood in the stool  Significant tenderness or worsening of abdominal pains  Swelling of the abdomen that is new, acute  Fever of 100F or higher   Black, tarry-looking stools  For urgent or emergent issues, a gastroenterologist can be reached at any hour by calling [phone]. Do not use MyChart messaging for urgent concerns.    DIET:  We do recommend a small meal at first, but then you may proceed to your regular diet.  Drink plenty of fluids but you should avoid alcoholic  beverages for 24 hours. Try to increase the fiber in your diet, and drink plenty of water.   ACTIVITY:  You should plan to take it easy for the rest of today and you should NOT DRIVE or use heavy machinery until tomorrow (because of the sedation medicines used during the test).    FOLLOW UP: Our staff will call the number listed on your records the next business day following your procedure.  We will call around 7:15- 8:00 am to check on you and address any questions or concerns that you may have regarding the information given to you following your procedure. If we do not reach you, we will leave a message.     If any biopsies were taken you will be contacted by phone or by letter within the next 1-3 weeks.  Please call us at [phone] if you have not heard about the biopsies in 3 weeks.    SIGNATURES/CONFIDENTIALITY: You and/or your care partner have signed paperwork which will be entered into your electronic medical record.  These signatures attest to the fact that that the information above on your After Visit Summary has been reviewed and is understood.  Full responsibility of the confidentiality of this discharge information lies with you and/or your care-partner.

## 2022-11-08 NOTE — Progress Notes (Signed)
Pt's states no medical or surgical changes since previsit or office visit. 

## 2022-11-11 ENCOUNTER — Telehealth: Payer: Self-pay

## 2022-11-11 NOTE — Telephone Encounter (Signed)
Attempted f/u call. No answer, left VM. 

## 2022-11-17 LAB — SURGICAL PATHOLOGY

## 2022-11-25 ENCOUNTER — Encounter: Payer: Self-pay | Admitting: Gastroenterology

## 2023-10-19 ENCOUNTER — Other Ambulatory Visit: Payer: Self-pay

## 2023-10-19 ENCOUNTER — Emergency Department (HOSPITAL_BASED_OUTPATIENT_CLINIC_OR_DEPARTMENT_OTHER)
Admission: EM | Admit: 2023-10-19 | Discharge: 2023-10-19 | Disposition: A | Discharge status: Home / Self Care | Attending: Emergency Medicine | Admitting: Emergency Medicine

## 2023-10-19 ENCOUNTER — Encounter (HOSPITAL_BASED_OUTPATIENT_CLINIC_OR_DEPARTMENT_OTHER): Payer: Self-pay

## 2023-10-19 DIAGNOSIS — Y9353 Activity, golf: Secondary | ICD-10-CM | POA: Diagnosis not present

## 2023-10-19 DIAGNOSIS — S0993XA Unspecified injury of face, initial encounter: Secondary | ICD-10-CM | POA: Diagnosis present

## 2023-10-19 DIAGNOSIS — S0181XA Laceration without foreign body of other part of head, initial encounter: Secondary | ICD-10-CM | POA: Insufficient documentation

## 2023-10-19 DIAGNOSIS — W2104XA Struck by golf ball, initial encounter: Secondary | ICD-10-CM | POA: Diagnosis not present

## 2023-10-19 MED ORDER — LIDOCAINE-EPINEPHRINE (PF) 2 %-1:200000 IJ SOLN
10.0000 mL | Freq: Once | INTRAMUSCULAR | Status: AC
  Administered 2023-10-19: 10 mL via INTRADERMAL
  Filled 2023-10-19: qty 20

## 2023-10-19 NOTE — Discharge Instructions (Signed)
 Return for redness drainage or if you get a fever.  The sutures that were used are dissolvable that should dissolve between day 3 and day 5.  If they are still there after day 7 then you can gently plucked them out with tweezers.  The area can get wet but not fully immersed underwater.  No scrubbing.  If you really want to clean it you can apply a half-and-half hydrogen peroxide solution with water on a Q-tip.  You can apply an ointment a couple times a day this could be as simple as Vaseline but could also be an antibiotic ointment if you wish.  Once it is healed please try to avoid prolonged sun exposure use sunscreen.  Gells that have silicone antigens have been shown to reduce scarring in some research.    Return for sudden worsening headache, confusion, vomiting.
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
## 2023-10-19 NOTE — ED Notes (Signed)
 Pt given discharge instructions and reviewed suture care. Opportunities given for questions. Pt verbalizes understanding. Bethena Powell SAUNDERS, RN
